# Patient Record
Sex: Male | Born: 1980 | Race: White | Hispanic: No | Marital: Married | Smoking: Never smoker
Health system: Southern US, Community
[De-identification: ages and names within clinical notes are randomized; demographics above are authoritative.]

## PROBLEM LIST (undated history)

## (undated) HISTORY — PX: VASECTOMY: SHX75

---

## 2015-08-22 ENCOUNTER — Encounter: Payer: Self-pay | Admitting: Emergency Medicine

## 2015-08-22 ENCOUNTER — Ambulatory Visit
Admission: EM | Admit: 2015-08-22 | Discharge: 2015-08-22 | Disposition: A | Discharge status: Home / Self Care | Payer: BLUE CROSS/BLUE SHIELD | Attending: Family Medicine | Admitting: Family Medicine

## 2015-08-22 DIAGNOSIS — L255 Unspecified contact dermatitis due to plants, except food: Secondary | ICD-10-CM

## 2015-08-22 MED ORDER — PREDNISONE 10 MG (21) PO TBPK: 10.0000 mg | ORAL_TABLET | Freq: Every day | ORAL | Status: DC

## 2015-08-22 NOTE — Discharge Instructions (Signed)
Contact Dermatitis Dermatitis is redness, soreness, and swelling (inflammation) of the skin. Contact dermatitis is a reaction to certain substances that touch the skin. There are two types of contact dermatitis:   Irritant contact dermatitis. This type is caused by something that irritates your skin, such as dry hands from washing them too much. This type does not require previous exposure to the substance for a reaction to occur. This type is more common.  Allergic contact dermatitis. This type is caused by a substance that you are allergic to, such as a nickel allergy or poison ivy. This type only occurs if you have been exposed to the substance (allergen) before. Upon a repeat exposure, your body reacts to the substance. This type is less common. CAUSES  Many different substances can cause contact dermatitis. Irritant contact dermatitis is most commonly caused by exposure to:   Makeup.   Soaps.   Detergents.   Bleaches.   Acids.   Metal salts, such as nickel.  Allergic contact dermatitis is most commonly caused by exposure to:   Poisonous plants.   Chemicals.   Jewelry.   Latex.   Medicines.   Preservatives in products, such as clothing.  RISK FACTORS This condition is more likely to develop in:   People who have jobs that expose them to irritants or allergens.  People who have certain medical conditions, such as asthma or eczema.  SYMPTOMS  Symptoms of this condition may occur anywhere on your body where the irritant has touched you or is touched by you. Symptoms include:  Dryness or flaking.   Redness.   Cracks.   Itching.   Pain or a burning feeling.   Blisters.  Drainage of small amounts of blood or clear fluid from skin cracks. With allergic contact dermatitis, there may also be swelling in areas such as the eyelids, mouth, or genitals.  DIAGNOSIS  This condition is diagnosed with a medical history and physical exam. A patch skin test  may be performed to help determine the cause. If the condition is related to your job, you may need to see an occupational medicine specialist. TREATMENT Treatment for this condition includes figuring out what caused the reaction and protecting your skin from further contact. Treatment may also include:   Steroid creams or ointments. Oral steroid medicines may be needed in more severe cases.  Antibiotics or antibacterial ointments, if a skin infection is present.  Antihistamine lotion or an antihistamine taken by mouth to ease itching.  A bandage (dressing). HOME CARE INSTRUCTIONS Skin Care  Moisturize your skin as needed.   Apply cool compresses to the affected areas.  Try taking a bath with:  Epsom salts. Follow the instructions on the packaging. You can get these at your local pharmacy or grocery store.  Baking soda. Pour a small amount into the bath as directed by your health care provider.  Colloidal oatmeal. Follow the instructions on the packaging. You can get this at your local pharmacy or grocery store.  Try applying baking soda paste to your skin. Stir water into baking soda until it reaches a paste-like consistency.  Do not scratch your skin.  Bathe less frequently, such as every other day.  Bathe in lukewarm water. Avoid using hot water. Medicines  Take or apply over-the-counter and prescription medicines only as told by your health care provider.   If you were prescribed an antibiotic medicine, take or apply your antibiotic as told by your health care provider. Do not stop using the   antibiotic even if your condition starts to improve. General Instructions  Keep all follow-up visits as told by your health care provider. This is important.  Avoid the substance that caused your reaction. If you do not know what caused it, keep a journal to try to track what caused it. Write down:  What you eat.  What cosmetic products you use.  What you drink.  What  you wear in the affected area. This includes jewelry.  If you were given a dressing, take care of it as told by your health care provider. This includes when to change and remove it. SEEK MEDICAL CARE IF:   Your condition does not improve with treatment.  Your condition gets worse.  You have signs of infection such as swelling, tenderness, redness, soreness, or warmth in the affected area.  You have a fever.  You have new symptoms. SEEK IMMEDIATE MEDICAL CARE IF:   You have a severe headache, neck pain, or neck stiffness.  You vomit.  You feel very sleepy.  You notice red streaks coming from the affected area.  Your bone or joint underneath the affected area becomes painful after the skin has healed.  The affected area turns darker.  You have difficulty breathing.   This information is not intended to replace advice given to you by your health care provider. Make sure you discuss any questions you have with your health care provider.   Document Released: 04/16/2000 Document Revised: 01/08/2015 Document Reviewed: 09/04/2014 Elsevier Interactive Patient Education 2016 [employer]  

## 2015-08-22 NOTE — ED Provider Notes (Signed)
CSN: [id number]     Arrival date & time 08/22/15  1191 History   First MD Initiated Contact with Patient 08/22/15 [phone]     Chief Complaint  Patient presents with  . 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 He has no lesions in his groin. He states that the itching is very bad. He has no pain. He did not change any of his personal care items or detergents. He  does remember working in the yard clearing out flowerbeds did not notice any vines etc. Shortly after performing the yard work he noticed the onset of the rash. Has been using over-the-counter hydrocortisone to help with the itching but despite that the rash still is present and spreading.     History reviewed. No pertinent past medical history. History reviewed. No pertinent past surgical history. No family history on file. Social History  Substance Use Topics  . Smoking status: Never Smoker   . Smokeless tobacco: None  . Alcohol Use: Yes     Comment: occasion    Review of Systems  Constitutional: Negative for fever, chills, activity change and fatigue.  Skin: Positive for color change and rash.  All other systems reviewed and are negative.   Allergies  Review of patient's allergies indicates no known allergies.  Home Medications   Prior to Admission medications   Medication Sig Start Date End Date Taking? Authorizing Provider  predniSONE (STERAPRED UNI-PAK 21 TAB) 10 MG (21) TBPK tablet Take 1 tablet (10 mg total) by mouth daily. Take 6 tabs by mouth daily  for 2 days, then 5 tabs for 2 days, then 4 tabs for 2 days, then 3 tabs for 2 days, 2 tabs for 2 days, then 1 tab by mouth daily for 2 days 08/22/15   Lutricia Feil, PA-C   Meds Ordered and Administered this Visit  Medications - No data to  display  BP 129/91 mmHg  Pulse 65  Temp(Src) 98.3 F (36.8 C) (Oral)  Resp 18  Ht  (1.753 m)  Wt 190 lb (86.183 kg)  BMI 28.05 kg/m2  SpO2 98% No data found.   Physical Exam  Constitutional: He is oriented to person, place, and time. He appears well-developed and well-nourished.  HENT:  Head: Normocephalic and atraumatic.  Eyes: Conjunctivae are normal. Pupils are equal, round, and reactive to light.  Neck: Normal range of motion. Neck supple.  Musculoskeletal: Normal range of motion. He exhibits no edema or tenderness.  Neurological: He is alert and oriented to person, place, and time.  Skin: Rash noted.  Examination of his skin shows a macular papular patchy rash on his arms and legs and her thighs and the most concentrated with excoriations is over the belt line and umbilical area more right than left. The back is spared as is the face. There are small vesicles but none are draining. Her several areas of linear rash present. There is no web space of lesions present. He has a few patches on his ankles.  Psychiatric: He has a normal mood and affect. His behavior is normal. Judgment and thought content normal.  Nursing note and vitals reviewed.   ED Course  Procedures (including critical care time)  Labs Review Labs Reviewed - No data to display  Imaging Review No results found.   Visual Acuity  Review  Right Eye Distance:   Left Eye Distance:   Bilateral Distance:    Right Eye Near:   Left Eye Near:    Bilateral Near:         MDM   1. Dermatitis due to plants, including poison ivy, sumac, and oak    Discharge Medication List as of 08/22/2015  9:43 AM    START taking these medications   Details  predniSONE (STERAPRED UNI-PAK 21 TAB) 10 MG (21) TBPK tablet Take 1 tablet (10 mg total) by mouth daily. Take 6 tabs by mouth daily  for 2 days, then 5 tabs for 2 days, then 4 tabs for 2 days, then 3 tabs for 2 days, 2 tabs for 2 days, then 1 tab by mouth daily  for 2 days, Starting 08/22/2015, Until Discontinued, P rint      Plan: 1. Test/x-ray results and diagnosis reviewed with patient 2. rx as per orders; risks, benefits, potential side effects reviewed with patient 3. Recommend supportive treatment with Use of Zyrtec or Allegra during the daytime and Benadryl at night to help with the itching. If he is not improving he should follow-up with the dermatologist. Based on his history with working in the yard most likely source of his dermatitis is from contact with the plan on simply of poison ivy. He should take better precautions in the future. 4. F/u prn if symptoms worsen or don't improve     Lutricia FeilWilliam P Roemer, PA-C 08/22/15 1002

## 2015-08-22 NOTE — ED Notes (Signed)
Rash to torso, arms and legs, states itches very bad, no pain.

## 2021-06-15 NOTE — Progress Notes (Signed)
06/16/2021 1:56 PM   Erik Wyatt [birth year] [id number]  Referring provider: No referring provider defined for this encounter.  Chief Complaint  Patient presents with   VAS Consult     HPI: Erik Wyatt is a 41 y.o. male who presents today for further evaluation of vasectomy.   He denies a history of testicular trauma or pain.  No urinary issues.  No previous scrotal surgeries.  He has one child.   PMH: No past medical history on file.  Surgical History: No past surgical history on file.  Home Medications:  Allergies as of 06/16/2021   No Known Allergies      Medication List        Accurate as of June 16, 2021  1:56 PM. If you have any questions, ask your nurse or doctor.          STOP taking these medications    predniSONE 10 MG (21) Tbpk tablet Commonly known as: STERAPRED UNI-PAK 21 TAB Stopped by: Erik Scotland, MD       TAKE these medications    sertraline 100 MG tablet Commonly known as: ZOLOFT Take by mouth.        Allergies: No Known Allergies  Family History: No family history on file.  Social History:  reports that he has never smoked. He does not have any smokeless tobacco history on file. He reports current alcohol use. No history on file for drug use.   Physical Exam: BP (!) 144/93    Pulse 78    Ht 5\' 9"  (1.753 m)    Wt 190 lb (86.2 kg)    BMI 28.06 kg/m   Constitutional:  Alert and oriented, No acute distress. HEENT: [place] AT, moist mucus membranes.  Trachea midline, no masses. Cardiovascular: No clubbing, cyanosis, or edema. Respiratory: Normal respiratory effort, no increased work of breathing. GI: Abdomen is soft, nontender, nondistended, no abdominal masses GU: Normal phallus.  Bilateral descended testicles without masses.  Vasa easily palpable bilaterally. Skin: No rashes, bruises or suspicious lesions. Neurologic: Grossly intact, no focal deficits, moving all 4 extremities. Psychiatric: Normal mood and  affect.   Assessment & Plan:    1. Vasectomy evaluation Today, we discussed what the vas deferens is, where it is located, and its function. We reviewed the procedure for vasectomy, it's risks, benefits, alternatives, and likelihood of achieving his goals. We discussed in detail the procedure, complications, and recovery as well as the need for clearance prior to unprotected intercourse. We discussed that vasectomy does not protect against sexually transmitted diseases. We discussed that this procedure does not result in immediate sterility and that they would need to use other forms of birth control until he has been cleared with negative postvasectomy semen analyses. I explained that the procedure is considered to be permanent and that attempts at reversal have varying degrees of success. These options include vasectomy reversal, sperm retrieval, and in vitro fertilization; these can be very expensive. We discussed the chance of postvasectomy pain syndrome which occurs in less than 5% of patients. I explained to the patient that there is no treatment to resolve this chronic pain, and that if it developed I would not be able to help resolve the issue, but that surgery is generally not needed for correction. I explained there have even been reports of systemic like illness associated with this chronic pain, and that there was no good cure. I explained that vasectomy it is not a 100% reliable form of birth control, and the  risk of pregnancy after vasectomy is approximately 1 in 2000 men who had a negative postvasectomy semen analysis or rare non-motile sperm. I explained that repeat vasectomy was necessary in less than 1% of vasectomy procedures when employing the type of technique that I use. I explained that he should refrain from ejaculation for approximately one week following vasectomy. I explained that there are other options for birth control which are permanent and non-permanent; we discussed these. I  explained the rates of surgical complications, such as symptomatic hematoma or infection, are low (1-2%) and vary with the surgeon's experience and criteria used to diagnose the complication.   The patient had the opportunity to ask questions to his stated satisfaction. He voiced understanding of the above factors and stated that he has read all the information provided to him and the packets and informed consent.  He is interested in receiving of Valium 10 mg prior to the procedure for the purpose of anxiolysis.  A prescription was given today.  He will have a driver on the day of the procedure.   Return for vasectomy   I,Erik Wyatt,acting as a scribe for Erik Scotland, MD.,have documented all relevant documentation on the behalf of Erik Scotland, MD,as directed by  Erik Scotland, MD while in the presence of Erik Scotland, MD.  [facility] [address] [place], [place] [address] [phone]

## 2021-06-16 ENCOUNTER — Ambulatory Visit (INDEPENDENT_AMBULATORY_CARE_PROVIDER_SITE_OTHER): Payer: 59 | Admitting: Urology

## 2021-06-16 ENCOUNTER — Other Ambulatory Visit: Payer: Self-pay

## 2021-06-16 ENCOUNTER — Encounter: Payer: Self-pay | Admitting: Urology

## 2021-06-16 VITALS — BP 144/93 | HR 78 | Ht 69.0 in | Wt 190.0 lb

## 2021-06-16 DIAGNOSIS — Z302 Encounter for sterilization: Secondary | ICD-10-CM

## 2021-06-16 NOTE — Progress Notes (Signed)
06/16/21    06/16/2021 8:21 AM   [name] 03-04-1981 [id number]  Referring provider: No referring provider defined for this encounter.  Chief Complaint  Patient presents with   VAS Consult    HPI: 41 y.o. year old male referred for further evaluation of possible vasectomy.  He denies a history of testicular trauma or pain.  No urinary issues.  No previous scrotal surgeries.  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  Both he and his partner agree that they are not interested in further children.   PMH: No past medical history on file.  Surgical History: No past surgical history on file.  Home Medications:  Allergies as of 06/16/2021   No Known Allergies      Medication List        Accurate as of June 16, 2021 11:59 PM. If you have any questions, ask your nurse or doctor.          STOP taking these medications    predniSONE 10 MG (21) Tbpk tablet Commonly known as: STERAPRED UNI-PAK 21 TAB Stopped by: Vanna Scotland, MD       TAKE these medications    diazepam 10 MG tablet Commonly known as: Valium Take 1 hour prior to procedure, need a driver Started by: Vanna Scotland, MD   sertraline 100 MG tablet Commonly known as: ZOLOFT Take by mouth.        Allergies: No Known Allergies  Family History: No family history on file.  Social History:  reports that he has never smoked. He does not have any smokeless tobacco history on file. He reports current alcohol use. No history on file for drug use.   Physical Exam: BP (!) 144/93    Pulse 78    Ht 5\' 9"  (1.753 m)    Wt 190 lb (86.2 kg)    BMI 28.06 kg/m   Constitutional:  Alert and oriented, No acute distress. HEENT: [place] AT, moist mucus membranes.  Trachea midline, no masses. Cardiovascular: No clubbing, cyanosis, or edema. Respiratory: Normal respiratory effort, no increased work of breathing. GI: Abdomen is soft, nontender, nondistended, no abdominal masses GU: Normal  phallus.  Bilateral descended testicles without masses.  Vasa easily palpable bilaterally. Skin: No rashes, bruises or suspicious lesions. Neurologic: Grossly intact, no focal deficits, moving all 4 extremities. Psychiatric: Normal mood and affect.   Assessment & Plan:    1. Vasectomy evaluation Today, we discussed what the vas deferens is, where it is located, and its function. We reviewed the procedure for vasectomy, it's risks, benefits, alternatives, and likelihood of achieving his goals. We discussed in detail the procedure, complications, and recovery as well as the need for clearance prior to unprotected intercourse. We discussed that vasectomy does not protect against sexually transmitted diseases. We discussed that this procedure does not result in immediate sterility and that they would need to use other forms of birth control until he has been cleared with negative postvasectomy semen analyses. I explained that the procedure is considered to be permanent and that attempts at reversal have varying degrees of success. These options include vasectomy reversal, sperm retrieval, and in vitro fertilization; these can be very expensive. We discussed the chance of postvasectomy pain syndrome which occurs in less than 5% of patients. I explained to the patient that there is no treatment to resolve this chronic pain, and that if it developed I would not be able to help resolve the issue, but that surgery  is generally not needed for correction. I explained there have even been reports of systemic like illness associated with this chronic pain, and that there was no good cure. I explained that vasectomy it is not a 100% reliable form of birth control, and the risk of pregnancy after vasectomy is approximately 1 in 2000 men who had a negative postvasectomy semen analysis or rare non-motile sperm. I explained that repeat vasectomy was necessary in less than 1% of vasectomy procedures when employing the type of  technique that I use. I explained that he should refrain from ejaculation for approximately one week following vasectomy. I explained that there are other options for birth control which are permanent and non-permanent; we discussed these. I explained the rates of surgical complications, such as symptomatic hematoma or infection, are low (1-2%) and vary with the surgeon's experience and criteria used to diagnose the complication.   The patient had the opportunity to ask questions to his stated satisfaction. He voiced understanding of the above factors and stated that he has read all the information provided to him and the packets and informed consent.  He is interested in receiving of Valium 10 mg prior to the procedure for the purpose of anxiolysis.  A prescription was given today.  He will have a driver on the day of the procedure.  Hollice Espy, MD  [facility] [address], [place], [place] [address] [phone]

## 2021-06-16 NOTE — Patient Instructions (Signed)

## 2021-06-17 MED ORDER — DIAZEPAM 10 MG PO TABS: ORAL_TABLET | ORAL | 0 refills | Status: DC

## 2021-07-09 NOTE — Progress Notes (Signed)
07/10/2021 ? ?CC:  ?Chief Complaint  ?Patient presents with  ? VAS  ? ? ?HPI: ?Erik Wyatt is a 41 y.o. male who presents today for a vasectomy.  ? ?He denies a history of testicular trauma or pain. No urinary issues.  No previous scrotal surgeries. ?  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 Both he and his partner agree that they are not interested in further children. ? ?Vitals:  ? 07/10/21 1556  ?BP: 132/83  ?Pulse: 76  ? ?NED. A&Ox3.   ?No respiratory distress   ?Abd soft, NT, ND ?Normal external genitalia with patent urethral meatus ? ? ?Bilateral Vasectomy Procedure ? ?Pre-Procedure: ?- Patient's scrotum was prepped and draped for vasectomy. ?- The vas was palpated through the scrotal skin on the left. ?- 1% Xylocaine was injected into the skin and surrounding tissue for placement  ?- In a similar manner, the vas on the right was identified, anesthetized, and stabilized. ? ?Procedure: ?- A #11 blade was used to make a small stab incision in the skin overlying the vas ?- The left vas was isolated and brought up through the incision exposing that structure. ?- Bleeding points were cauterized as they occurred. ?- The vas was free from the surrounding structures and brought to the view. ?- A segment was positioned for placement with a hemostat. ?- A second hemostat was placed and a small segment between the two hemostats and was removed for inspection. ?- Each end of the transected vas lumen was fulgurated/ obliterated using needlepoint electrocautery ?-A fascial interposition was performed on testicular end of the vas using #3-0 chromic suture ?-The same procedure was performed on the right. ?- A single suture of #3-0 chromic catgut was used to close each lateral scrotal skin incision ?- A dressing was applied. ? ?Post-Procedure: ?- Patient was instructed in care of the operative area ?- A specimen is to be delivered in 12 weeks  ? -Another form of contraception is to be used until post vasectomy  semen analysis ? ?Tawni Millers as a scribe for Vanna Scotland, MD.,have documented all relevant documentation on the behalf of Vanna Scotland, MD,as directed by  Vanna Scotland, MD while in the presence of Vanna Scotland, MD. ? ? ?

## 2021-07-10 ENCOUNTER — Encounter: Payer: 59 | Admitting: Urology

## 2021-07-10 ENCOUNTER — Ambulatory Visit: Payer: 59 | Admitting: Urology

## 2021-07-10 ENCOUNTER — Encounter: Payer: Self-pay | Admitting: Urology

## 2021-07-10 ENCOUNTER — Other Ambulatory Visit: Payer: Self-pay

## 2021-07-10 VITALS — BP 132/83 | HR 76 | Ht 69.0 in | Wt 190.0 lb

## 2021-07-10 DIAGNOSIS — Z302 Encounter for sterilization: Secondary | ICD-10-CM

## 2021-07-10 NOTE — Patient Instructions (Signed)

## 2021-10-05 ENCOUNTER — Ambulatory Visit
Admission: EM | Admit: 2021-10-05 | Discharge: 2021-10-05 | Disposition: A | Discharge status: Home / Self Care | Payer: Managed Care, Other (non HMO) | Attending: Internal Medicine | Admitting: Internal Medicine

## 2021-10-05 ENCOUNTER — Ambulatory Visit (INDEPENDENT_AMBULATORY_CARE_PROVIDER_SITE_OTHER): Payer: Managed Care, Other (non HMO)

## 2021-10-05 DIAGNOSIS — S67193A Crushing injury of left middle finger, initial encounter: Secondary | ICD-10-CM

## 2021-10-05 NOTE — ED Triage Notes (Signed)
Pt c/o crush injury to middle finger of left hand. Pt was trying to move a slab of rock and while doing so slammed his finger in between it and another slab of rock.
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
## 2021-10-05 NOTE — Discharge Instructions (Signed)
Clean the wound with soap and water and apply antibiotic ointment twice a day Elevate to help relieve the pain OK to take Tylenol 500 mg 1-2 every 6 hours for pain\ Ice for 15 minutes 3-4 times a day for 48 hours from the time of injury Follow up with your PCP in one week.

## 2021-10-09 ENCOUNTER — Other Ambulatory Visit: Payer: Managed Care, Other (non HMO)

## 2021-10-09 ENCOUNTER — Other Ambulatory Visit: Payer: Self-pay

## 2021-10-09 DIAGNOSIS — Z302 Encounter for sterilization: Secondary | ICD-10-CM

## 2021-10-10 LAB — POST-VAS SPERM EVALUATION,QUAL: Volume: 2 mL

## 2022-10-09 ENCOUNTER — Ambulatory Visit
Admission: EM | Admit: 2022-10-09 | Discharge: 2022-10-09 | Disposition: A | Discharge status: Home / Self Care | Payer: Managed Care, Other (non HMO) | Attending: Physician Assistant | Admitting: Physician Assistant

## 2022-10-09 DIAGNOSIS — S01511A Laceration without foreign body of lip, initial encounter: Secondary | ICD-10-CM | POA: Diagnosis not present

## 2022-10-09 MED ORDER — AMOXICILLIN-POT CLAVULANATE 875-125 MG PO TABS: 1.0000 | ORAL_TABLET | Freq: Two times a day (BID) | ORAL | 0 refills | Status: AC

## 2022-10-09 MED ORDER — AMOXICILLIN-POT CLAVULANATE 875-125 MG PO TABS: 1.0000 | ORAL_TABLET | Freq: Two times a day (BID) | ORAL | 0 refills | Status: DC
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
## 2023-01-18 IMAGING — CR DG FINGER MIDDLE 2+V*L*
3 series · 3 of 3 positions shown · non-contrast
Comparison: None Available.

CLINICAL DATA: Crush injury with open skin. Open wound anterior pad
of finger of left hand.

EXAM:
LEFT MIDDLE FINGER 2+V

[finger ap]
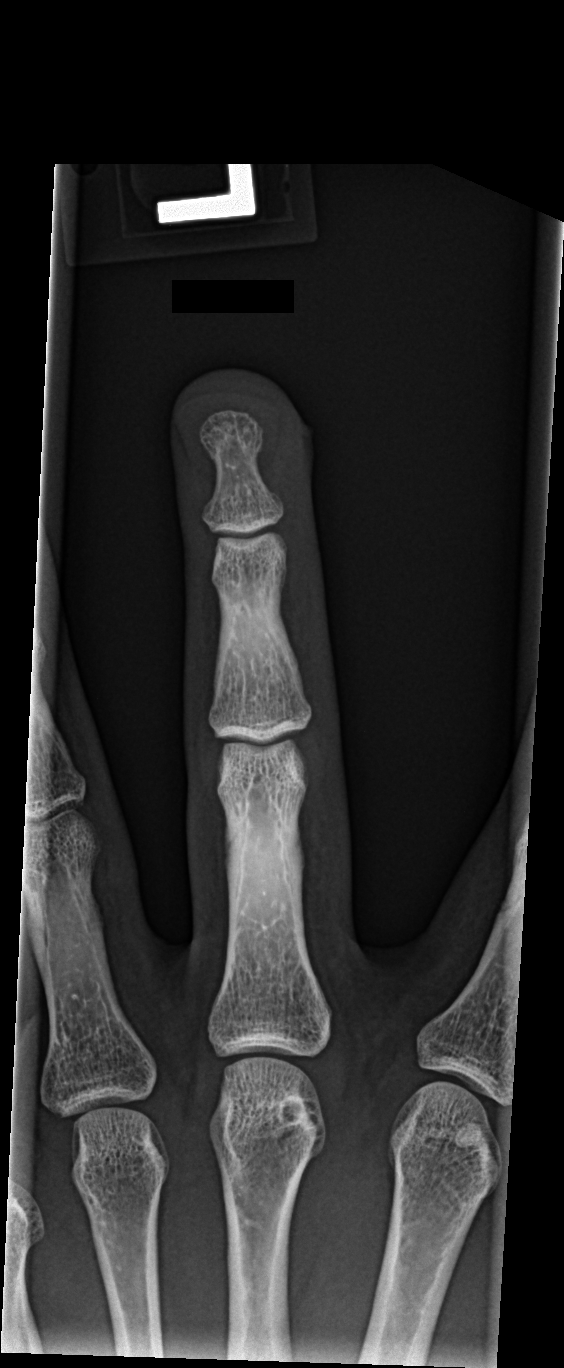

[finger obl]
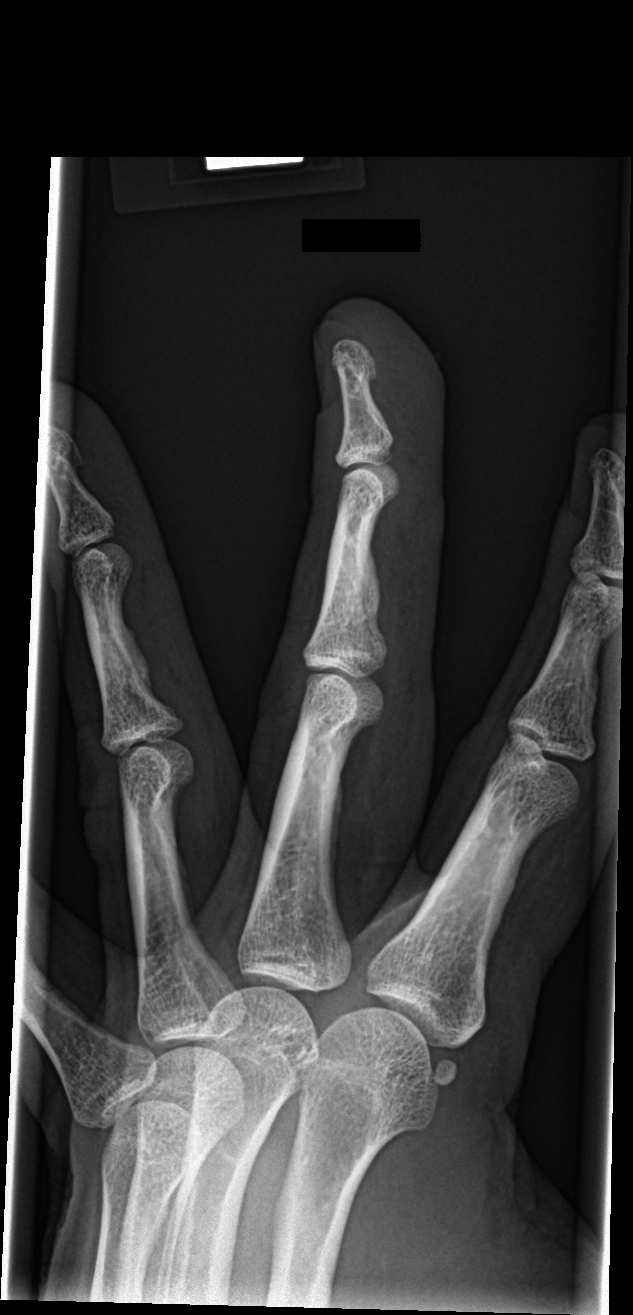

[finger lat]
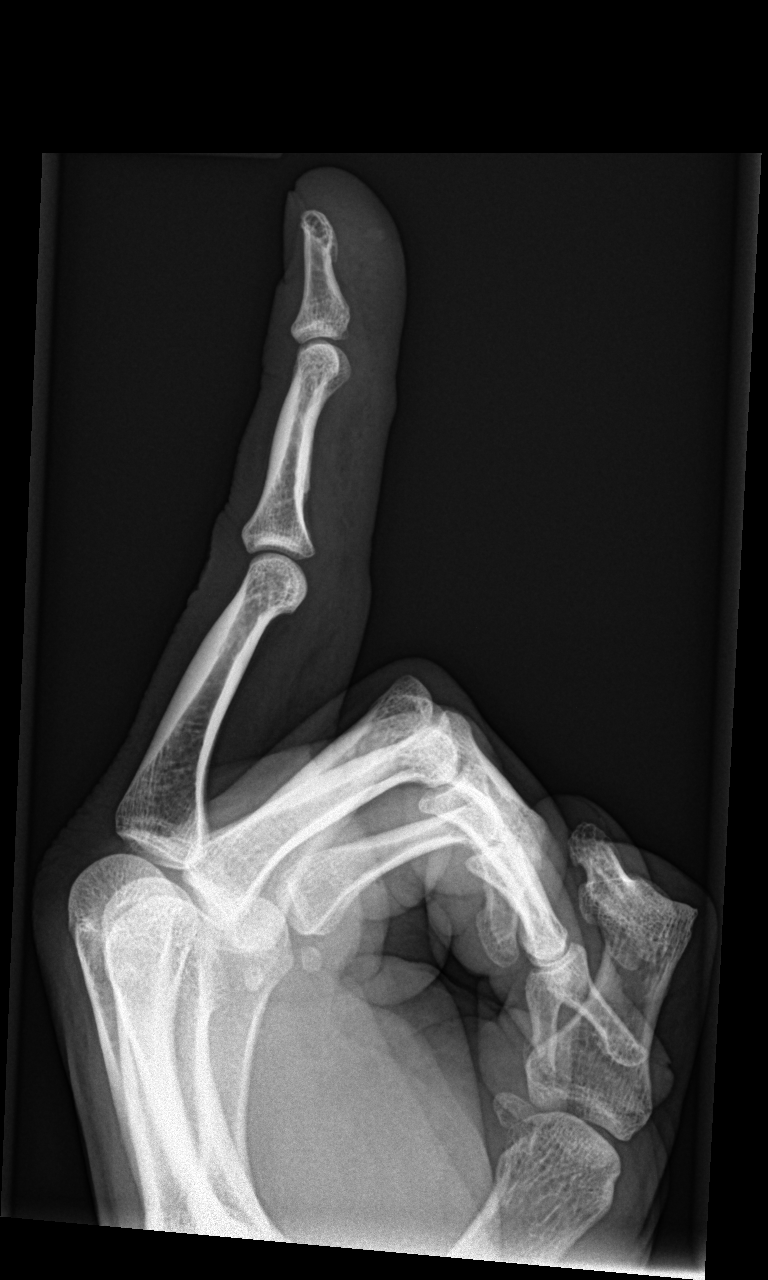

[3 of 3 positions shown; findings below may reference images not displayed]

FINDINGS: Normal bone mineralization. The cortices are intact. No acute
fracture is seen. No radiopaque foreign body. Joint spaces are
preserved.
IMPRESSION: 1. No acute bone traumatic abnormality is seen.
2. No radiopaque foreign body.

## 2024-05-24 ENCOUNTER — Ambulatory Visit (INDEPENDENT_AMBULATORY_CARE_PROVIDER_SITE_OTHER): Admitting: Urology

## 2024-05-24 VITALS — BP 130/89 | HR 60 | Ht 69.0 in | Wt 190.0 lb

## 2024-05-24 DIAGNOSIS — E291 Testicular hypofunction: Secondary | ICD-10-CM

## 2024-05-24 NOTE — Patient Instructions (Signed)
 Stop taking your testosterone gel around March, we will recheck testosterone levels in June and discussed options moving forward including going back on testosterone, trying Clomid, or seeing if your normal testosterone level returns to normal

## 2024-05-24 NOTE — Progress Notes (Signed)
" ° °  05/24/2024 9:01 AM   Erik Wyatt [birth year] [id number]  Reason for visit: Hypogonadism  History: Underwent vasectomy with Dr. Penne March 2023 Here today to establish care for hypogonadism  Physical Exam: BP 130/89 (BP Location: Left Arm, Patient Position: Sitting, Cuff Size: Normal)   Pulse 60   Ht 5' 9 (1.753 m)   Wt 190 lb (86.2 kg)   SpO2 99%   BMI 28.06 kg/m  Healthy appearing 44 year old male  Imaging/labs: Labs reviewed  Today: He has seen both endocrinology and PCP for hypogonadism management.  Unfortunately all of those records are not available to me.  Reportedly had low levels and low libido and was started on testosterone injections initially and levels jumped up to 800.  He had some anxiety regarding the injections and changed over to topical gel in the summer 2025.  He had improved libido and energy on that medication.  He wonders now if he should stay on testosterone or what other options he might have.  He is very active with exercise  Plan:   Hypogonadism: We had a long conversation about the risks and benefits of testosterone replacement and the guidelines regarding options.  Essentially he would like to finish off his current AndroGel prescription for the next 2 months and then come off testosterone to see where levels return to after a few months.  We discussed levels will drop before his endogenous testosterone production returns.  We discussed other options like Clomid in the future if levels are borderline or significantly low, or returning to exogenous testosterone if he is significantly symptomatic with documented levels below 300 x 2 RTC June 2026 with morning testosterone prior, plan to stop testosterone March 2026   Erik JAYSON Burnet, MD  [facility] [address], [address] [place], [place] [address] [phone]  "

## 2024-10-12 ENCOUNTER — Other Ambulatory Visit

## 2024-10-15 ENCOUNTER — Other Ambulatory Visit

## 2024-10-25 ENCOUNTER — Ambulatory Visit: Admitting: Urology

## 2024-10-26 ENCOUNTER — Ambulatory Visit: Admitting: Urology
